# Patient Record
Sex: Male | Born: 1965 | Race: White | Hispanic: No | Smoking: Current every day smoker
Health system: Southern US, Community
[De-identification: ages and names within clinical notes are randomized; demographics above are authoritative.]

## PROBLEM LIST (undated history)

## (undated) DIAGNOSIS — I10 Essential (primary) hypertension: Secondary | ICD-10-CM

## (undated) DIAGNOSIS — B1920 Unspecified viral hepatitis C without hepatic coma: Secondary | ICD-10-CM

## (undated) HISTORY — PX: HIP SURGERY: SHX245

## (undated) HISTORY — PX: KNEE SURGERY: SHX244

## (undated) HISTORY — PX: SHOULDER SURGERY: SHX246

---

## 2004-02-22 ENCOUNTER — Ambulatory Visit (HOSPITAL_COMMUNITY): Admission: RE | Admit: 2004-02-22 | Discharge: 2004-02-22 | Payer: Self-pay | Admitting: Internal Medicine

## 2006-09-16 ENCOUNTER — Ambulatory Visit (HOSPITAL_BASED_OUTPATIENT_CLINIC_OR_DEPARTMENT_OTHER): Admission: RE | Admit: 2006-09-16 | Discharge: 2006-09-16 | Payer: Self-pay | Admitting: Orthopedic Surgery

## 2009-02-11 ENCOUNTER — Ambulatory Visit (HOSPITAL_COMMUNITY): Admission: RE | Admit: 2009-02-11 | Discharge: 2009-02-11 | Payer: Self-pay | Admitting: Internal Medicine

## 2010-06-10 ENCOUNTER — Encounter: Payer: Self-pay | Admitting: Internal Medicine

## 2010-10-06 NOTE — Op Note (Signed)
NAMEVERYL, WINEMILLER                 ACCOUNT NO.:  [id number]   MEDICAL RECORD NO.:  [id number]          PATIENT TYPE:  AMB   LOCATION:  DSC                          FACILITY:  [facility]   PHYSICIAN:  Harvie Junior, M.D.   DATE OF BIRTH:  March 17, 1966   DATE OF PROCEDURE:  09/16/2006  DATE OF DISCHARGE:                               OPERATIVE REPORT   PREOPERATIVE DIAGNOSIS:  Impingement, acromioclavicular joint arthritis,  and superior labral tear with questionable high grade to full thickness  rotator cuff tear.   POSTOPERATIVE DIAGNOSES:  1. Impingement, right shoulder.  2. Acromioclavicular joint arthritis, right shoulder.  3. Posterior superior labral tear.  4. Partial thickness tearing of rotator cuff.   PRINCIPAL PROCEDURE:  1. Arthroscopic subacromial decompression from a lateral and posterior      compartment.  2. Arthroscopic distal clavicle resection of the entire distal portion      of the clavicle through the anterior compartment.  3. Debridement of the posterior superior labrum and the undersurface      of the rotator cuff from within the glenohumeral joint, as well as      subtotal bursectomy in the subacromial space.   SURGEON:  Harvie Junior, M.D.   ASSISTANT:  Marshia Ly, P.A.   ANESTHESIA:  General.   BRIEF HISTORY:  Mr. Borghi is a 45 year old gentleman with a long history  of having significant right shoulder pain.  He was ultimately working in  a [employer], where he fell on some mattress and had sudden onset  of pain.  MRI was obtained, which showed partial thickness, potentially  high grade partial thickness, to maybe small full thickness tear.  He  was evaluated in the office.  I read his MRI and was concerned about  this, as well, because of his failure of conservative care over a long  period of time since his injury, we ultimately felt that surgical  intervention was appropriate and he was brought to the operating room  for this  procedure.   PROCEDURE:  The patient brought to the operating room.  After adequate  anesthesia was obtained with a general anesthetic, the patient was  placed on the operating table.  The right shoulder was prepped and  draped in the usual sterile fashion.  Following this, routine  arthroscopic examination of the shoulder revealed there was an obvious  impingement on the rotator cuff.  This was addressed with subacromial  decompression.  Distal clavicle was resected over a 2-cm segment and  this went uneventfully.  The rotator cuff was evaluated from the  superior surface and no high grade or full thickness lesions could be  seen.  Subtotal bursectomy was performed and a probe was used at length,  top side, to fill the rotator cuff.  Prior to going in the subacromial  space, the surgery started with arthroscopy of the glenohumeral joint.  He was noted to have a posterior superior labral tear, which was  debrided and anteriorly had a sublabral hole, but it did not appear to  be unstable.  The biceps tendon  certainly was not able to be pushed down  into the glenohumeral joint.  The glenohumeral joint itself was  pristine.  Rotator cuff undersurface had no full thickness defect.  There was kind of the arching rim that we sometimes see with some  fibrous tissue coming from that and I evaluated that at length.  I  actually put the shaver on it to just see if it was wispy, but it  actually felt pretty dense on the undersurface and is outlined  previously in the previous paragraph about the superior surface looked  pristine.  So I really do not think he had any kind of high grade or  high level partial, and certainly no full thickness tearing.  After the  surgery had been completed, the shoulder was suctioned dry.  The portals  were closed with a bandage.  Twenty mL of 0.25% Marcaine was instilled  in the shoulder postoperatively by Anesthesia.  A sterile compression  dressing was applied.   The patient taken to recovery and was noted to be  in satisfactory condition.  Estimated blood loss for the procedure was  not significant.      Harvie Junior, M.D.  Electronically Signed     JLG/MEDQ  D:  09/16/2006  T:  09/16/2006  Job:  [id number]

## 2011-02-21 ENCOUNTER — Emergency Department (HOSPITAL_COMMUNITY)
Admission: EM | Admit: 2011-02-21 | Discharge: 2011-02-21 | Disposition: A | Discharge status: Home / Self Care | Payer: Self-pay | Attending: Physician Assistant | Admitting: Physician Assistant

## 2011-02-21 ENCOUNTER — Encounter: Payer: Self-pay | Admitting: Emergency Medicine

## 2011-02-21 DIAGNOSIS — S51009A Unspecified open wound of unspecified elbow, initial encounter: Secondary | ICD-10-CM | POA: Insufficient documentation

## 2011-02-21 DIAGNOSIS — F17200 Nicotine dependence, unspecified, uncomplicated: Secondary | ICD-10-CM | POA: Insufficient documentation

## 2011-02-21 DIAGNOSIS — W268XXA Contact with other sharp object(s), not elsewhere classified, initial encounter: Secondary | ICD-10-CM | POA: Insufficient documentation

## 2011-02-21 DIAGNOSIS — S51019A Laceration without foreign body of unspecified elbow, initial encounter: Secondary | ICD-10-CM

## 2011-02-21 HISTORY — DX: Essential (primary) hypertension: I10

## 2011-02-21 MED ORDER — LIDOCAINE-EPINEPHRINE (PF) 1 %-1:200000 IJ SOLN
INTRAMUSCULAR | Status: AC
  Filled 2011-02-21: qty 10

## 2011-02-21 MED ORDER — LIDOCAINE-EPINEPHRINE (PF) 1 %-1:200000 IJ SOLN: 10.0000 mL | Freq: Once | INTRAMUSCULAR | Status: DC

## 2011-02-21 MED ORDER — TETANUS-DIPHTH-ACELL PERTUSSIS 5-2.5-18.5 LF-MCG/0.5 IM SUSP
0.5000 mL | Freq: Once | INTRAMUSCULAR | Status: AC
  Administered 2011-02-21: 0.5 mL via INTRAMUSCULAR
  Filled 2011-02-21: qty 0.5

## 2011-02-21 NOTE — ED Provider Notes (Signed)
History     CSN: [id number] Arrival date & time: 02/21/2011  5:58 PM  Chief Complaint  Patient presents with  . Laceration    (Consider location/radiation/quality/duration/timing/severity/associated sxs/prior treatment) Patient is a 45 y.o. male presenting with skin laceration. The history is provided by the patient.  Laceration  The incident occurred 1 to 2 hours ago. The laceration is located on the right arm. The laceration is 6 cm in size. The laceration mechanism was a broken glass. The pain is moderate. He reports no foreign bodies present. His tetanus status is out of date.    Past Medical History  Diagnosis Date  . Hypertension     Past Surgical History  Procedure Date  . Hip surgery   . Shoulder surgery   . Knee surgery     History reviewed. No pertinent family history.  History  Substance Use Topics  . Smoking status: Current Everyday Smoker    Types: Cigarettes  . Smokeless tobacco: Not on file  . Alcohol Use: No      Review of Systems  Constitutional: Negative for activity change.       All ROS Neg except as noted in HPI  HENT: Negative for nosebleeds and neck pain.   Eyes: Negative for photophobia and discharge.  Respiratory: Negative for cough, shortness of breath and wheezing.   Cardiovascular: Negative for chest pain and palpitations.  Gastrointestinal: Negative for abdominal pain and blood in stool.  Genitourinary: Negative for dysuria, frequency and hematuria.  Musculoskeletal: Negative for back pain and arthralgias.  Skin: Negative.   Neurological: Negative for dizziness, seizures and speech difficulty.  Psychiatric/Behavioral: Negative for hallucinations and confusion.    Allergies  Codeine  Home Medications  No current outpatient prescriptions on file.  BP 145/84  Pulse 60  Temp(Src) 98.7 F (37.1 C) (Oral)  Resp 20  Ht 6' (1.829 m)  Wt 242 lb (109.77 kg)  BMI 32.82 kg/m2  SpO2 97%  Physical Exam  Nursing note and vitals  reviewed. Constitutional: He is oriented to person, place, and time. He appears well-developed and well-nourished.  Non-toxic appearance.  HENT:  Head: Normocephalic.  Right Ear: Tympanic membrane and external ear normal.  Left Ear: Tympanic membrane and external ear normal.  Eyes: EOM and lids are normal. Pupils are equal, round, and reactive to light.  Neck: Normal range of motion. Neck supple. Carotid bruit is not present.  Cardiovascular: Normal rate, regular rhythm, normal heart sounds, intact distal pulses and normal pulses.   Pulmonary/Chest: Breath sounds normal. No respiratory distress.  Abdominal: Soft. Bowel sounds are normal. There is no tenderness. There is no guarding.  Musculoskeletal: Normal range of motion.       Laceration of the ulnar surface of the forearm/elbow. FROM of fingers and wrist. Sensory wnl. Good cap refill.  Lymphadenopathy:       Head (right side): No submandibular adenopathy present.       Head (left side): No submandibular adenopathy present.    He has no cervical adenopathy.  Neurological: He is alert and oriented to person, place, and time. He has normal strength. No cranial nerve deficit or sensory deficit.  Skin: Skin is warm and dry.  Psychiatric: He has a normal mood and affect. His speech is normal.    ED Course  LACERATION REPAIR Date/Time: 02/21/2011 6:52 PM Performed by: Kathie Dike Authorized by: Kathie Dike Consent: Verbal consent obtained. Risks and benefits: risks, benefits and alternatives were discussed Consent given by: patient Patient  understanding: patient states understanding of the procedure being performed Patient identity confirmed: verbally with patient Time out: Immediately prior to procedure a "time out" was called to verify the correct patient, procedure, equipment, support staff and site/side marked as required. Body area: upper extremity Location details: right elbow Laceration length: 6.3 cm Tendon  involvement: none Nerve involvement: none Vascular damage: no Anesthesia: local infiltration Local anesthetic: lidocaine 1% with epinephrine Patient sedated: no Preparation: Patient was prepped and draped in the usual sterile fashion. Irrigation solution: saline Amount of cleaning: standard Debridement: none Degree of undermining: none Skin closure: staples Number of sutures: 5 Approximation: close Approximation difficulty: simple Dressing: 4x4 sterile gauze and gauze roll Patient tolerance: Patient tolerated the procedure well with no immediate complications. Comments: Sterile dressing applied by me.   (including critical care time)  Labs Reviewed - No data to display No results found.   1. Laceration of elbow       MDM  I have reviewed nursing notes, vital signs, and all appropriate lab and imaging results for this patient.        Kathie Dike, [place] 02/21/11 1902

## 2011-02-21 NOTE — ED Notes (Signed)
Patient with no complaints at this time. Respirations even and unlabored. Skin warm/dry. Discharge instructions reviewed with patient at this time. Patient given opportunity to voice concerns/ask questions. Patient discharged at this time and left Emergency Department with steady gait.   

## 2011-02-21 NOTE — ED Notes (Signed)
Pt has laceration to the rt forearm. Pt was cut with glass while trying to fix a screen door. Tetanus is unknown.

## 2011-02-21 NOTE — ED Provider Notes (Signed)
Medical screening examination/treatment/procedure(s) were performed by non-physician practitioner and as supervising physician I was immediately available for consultation/collaboration.   Benny Lennert, MD 02/21/11 [phone]

## 2011-09-08 ENCOUNTER — Emergency Department (HOSPITAL_COMMUNITY): Payer: Medicaid Other

## 2011-09-08 ENCOUNTER — Encounter (HOSPITAL_COMMUNITY): Payer: Self-pay | Admitting: *Deleted

## 2011-09-08 ENCOUNTER — Emergency Department (HOSPITAL_COMMUNITY)
Admission: EM | Admit: 2011-09-08 | Discharge: 2011-09-08 | Disposition: A | Discharge status: Other Acute Inpatient Hospital | Payer: Medicaid Other | Attending: Emergency Medicine | Admitting: Emergency Medicine

## 2011-09-08 DIAGNOSIS — S41119A Laceration without foreign body of unspecified upper arm, initial encounter: Secondary | ICD-10-CM

## 2011-09-08 DIAGNOSIS — S12100A Unspecified displaced fracture of second cervical vertebra, initial encounter for closed fracture: Secondary | ICD-10-CM | POA: Insufficient documentation

## 2011-09-08 DIAGNOSIS — S9030XA Contusion of unspecified foot, initial encounter: Secondary | ICD-10-CM | POA: Insufficient documentation

## 2011-09-08 DIAGNOSIS — S8010XA Contusion of unspecified lower leg, initial encounter: Secondary | ICD-10-CM | POA: Insufficient documentation

## 2011-09-08 DIAGNOSIS — M79609 Pain in unspecified limb: Secondary | ICD-10-CM | POA: Insufficient documentation

## 2011-09-08 DIAGNOSIS — S41109A Unspecified open wound of unspecified upper arm, initial encounter: Secondary | ICD-10-CM | POA: Insufficient documentation

## 2011-09-08 DIAGNOSIS — IMO0002 Reserved for concepts with insufficient information to code with codable children: Secondary | ICD-10-CM | POA: Insufficient documentation

## 2011-09-08 DIAGNOSIS — I10 Essential (primary) hypertension: Secondary | ICD-10-CM | POA: Insufficient documentation

## 2011-09-08 DIAGNOSIS — M25559 Pain in unspecified hip: Secondary | ICD-10-CM | POA: Insufficient documentation

## 2011-09-08 HISTORY — DX: Unspecified viral hepatitis C without hepatic coma: B19.20

## 2011-09-08 MED ORDER — TETANUS-DIPHTH-ACELL PERTUSSIS 5-2.5-18.5 LF-MCG/0.5 IM SUSP
0.5000 mL | Freq: Once | INTRAMUSCULAR | Status: DC
  Filled 2011-09-08: qty 0.5

## 2011-09-08 MED ORDER — ONDANSETRON HCL 4 MG/2ML IJ SOLN
4.0000 mg | Freq: Once | INTRAMUSCULAR | Status: AC
  Administered 2011-09-08: 4 mg via INTRAVENOUS
  Filled 2011-09-08: qty 2

## 2011-09-08 MED ORDER — HYDROMORPHONE HCL PF 1 MG/ML IJ SOLN
1.0000 mg | Freq: Once | INTRAMUSCULAR | Status: AC
  Administered 2011-09-08: 1 mg via INTRAVENOUS
  Filled 2011-09-08: qty 1

## 2011-09-08 MED ORDER — CEFAZOLIN SODIUM 1-5 GM-% IV SOLN
1.0000 g | Freq: Three times a day (TID) | INTRAVENOUS | Status: DC
  Administered 2011-09-08: 1 g via INTRAVENOUS
  Filled 2011-09-08: qty 50

## 2011-09-08 NOTE — ED Notes (Signed)
Mouth swabs provided to patient at patient request

## 2011-09-08 NOTE — ED Notes (Signed)
Patient reports Tdap in 2012. Dr Judd Lien aware and verbal order to cancel Tdap obtained.

## 2011-09-08 NOTE — ED Notes (Signed)
Report called to [facility] ED. Report given to Ronaldo Miyamoto, [employer].

## 2011-09-08 NOTE — ED Notes (Signed)
[facility] left ED with patient. Patient in NAD upon departure.
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
## 2011-09-08 NOTE — ED Notes (Signed)
Patient requesting pain medication. Dr Judd Lien aware and verbal order for 1 mg dilaudid obtained.
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
## 2016-07-10 ENCOUNTER — Other Ambulatory Visit (HOSPITAL_COMMUNITY): Payer: Self-pay | Admitting: Family Medicine

## 2016-07-10 DIAGNOSIS — M545 Low back pain: Secondary | ICD-10-CM

## 2016-07-12 ENCOUNTER — Ambulatory Visit (HOSPITAL_COMMUNITY): Payer: Medicare Other

## 2016-07-19 ENCOUNTER — Ambulatory Visit (HOSPITAL_COMMUNITY)
Admission: RE | Admit: 2016-07-19 | Discharge: 2016-07-19 | Disposition: A | Discharge status: Home / Self Care | Payer: Medicare Other | Source: Ambulatory Visit | Attending: Family Medicine | Admitting: Family Medicine

## 2016-07-19 DIAGNOSIS — M545 Low back pain: Secondary | ICD-10-CM | POA: Diagnosis not present

## 2016-07-19 DIAGNOSIS — M47896 Other spondylosis, lumbar region: Secondary | ICD-10-CM | POA: Insufficient documentation

## 2016-07-19 DIAGNOSIS — M4807 Spinal stenosis, lumbosacral region: Secondary | ICD-10-CM | POA: Diagnosis not present

## 2016-07-19 DIAGNOSIS — M48061 Spinal stenosis, lumbar region without neurogenic claudication: Secondary | ICD-10-CM | POA: Diagnosis not present

## 2017-09-13 ENCOUNTER — Ambulatory Visit (HOSPITAL_COMMUNITY)
Admission: RE | Admit: 2017-09-13 | Discharge: 2017-09-13 | Disposition: A | Discharge status: Home / Self Care | Payer: Medicare HMO | Source: Ambulatory Visit | Attending: Family Medicine | Admitting: Family Medicine

## 2017-09-13 ENCOUNTER — Other Ambulatory Visit (HOSPITAL_COMMUNITY): Payer: Self-pay | Admitting: Family Medicine

## 2017-09-13 DIAGNOSIS — M25551 Pain in right hip: Secondary | ICD-10-CM | POA: Diagnosis not present

## 2017-09-13 DIAGNOSIS — R52 Pain, unspecified: Secondary | ICD-10-CM

## 2017-09-13 DIAGNOSIS — G8929 Other chronic pain: Secondary | ICD-10-CM | POA: Diagnosis present

## 2018-08-11 ENCOUNTER — Other Ambulatory Visit (HOSPITAL_COMMUNITY): Payer: Self-pay | Admitting: Family Medicine

## 2018-08-11 ENCOUNTER — Other Ambulatory Visit: Payer: Self-pay

## 2018-08-11 ENCOUNTER — Ambulatory Visit (HOSPITAL_COMMUNITY)
Admission: RE | Admit: 2018-08-11 | Discharge: 2018-08-11 | Disposition: A | Discharge status: Home / Self Care | Payer: Medicare HMO | Source: Ambulatory Visit | Attending: Family Medicine | Admitting: Family Medicine

## 2018-08-11 DIAGNOSIS — M25512 Pain in left shoulder: Secondary | ICD-10-CM

## 2020-07-04 IMAGING — DX LEFT SHOULDER - 2+ VIEW
3 series · 3 of 3 positions shown · non-contrast
Comparison: None.

CLINICAL DATA: Left shoulder pain radiating into the left arm for 2
months. No known injury.

EXAM:
LEFT SHOULDER - 2+ VIEW

[shoulder grashey]
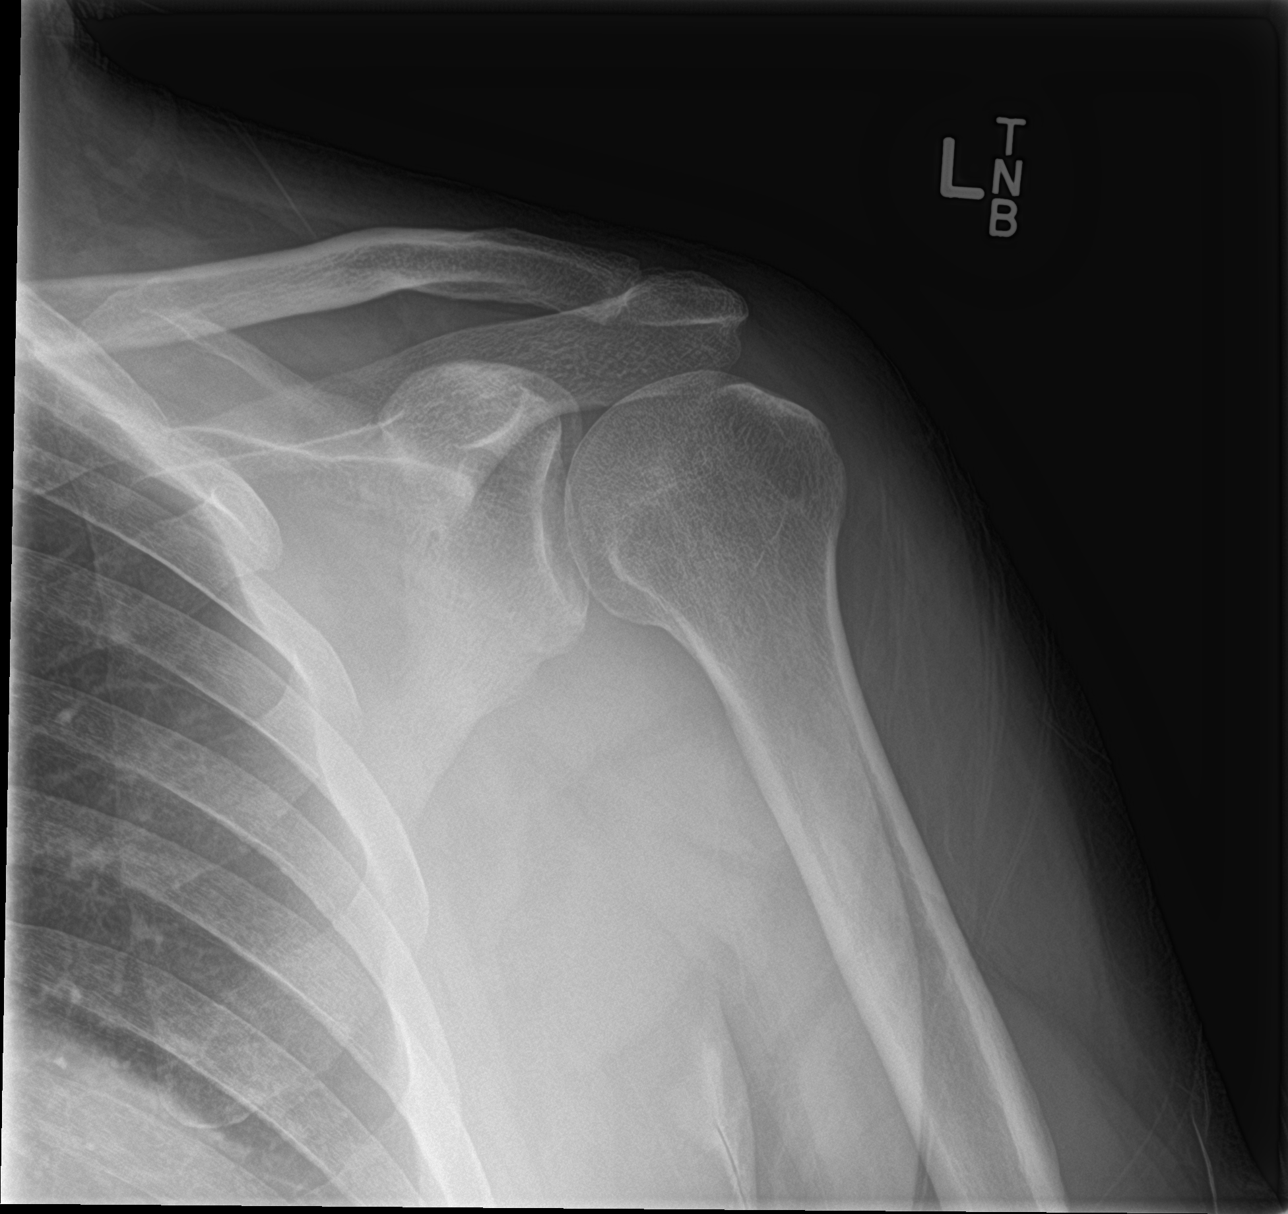

[shoulder y view]
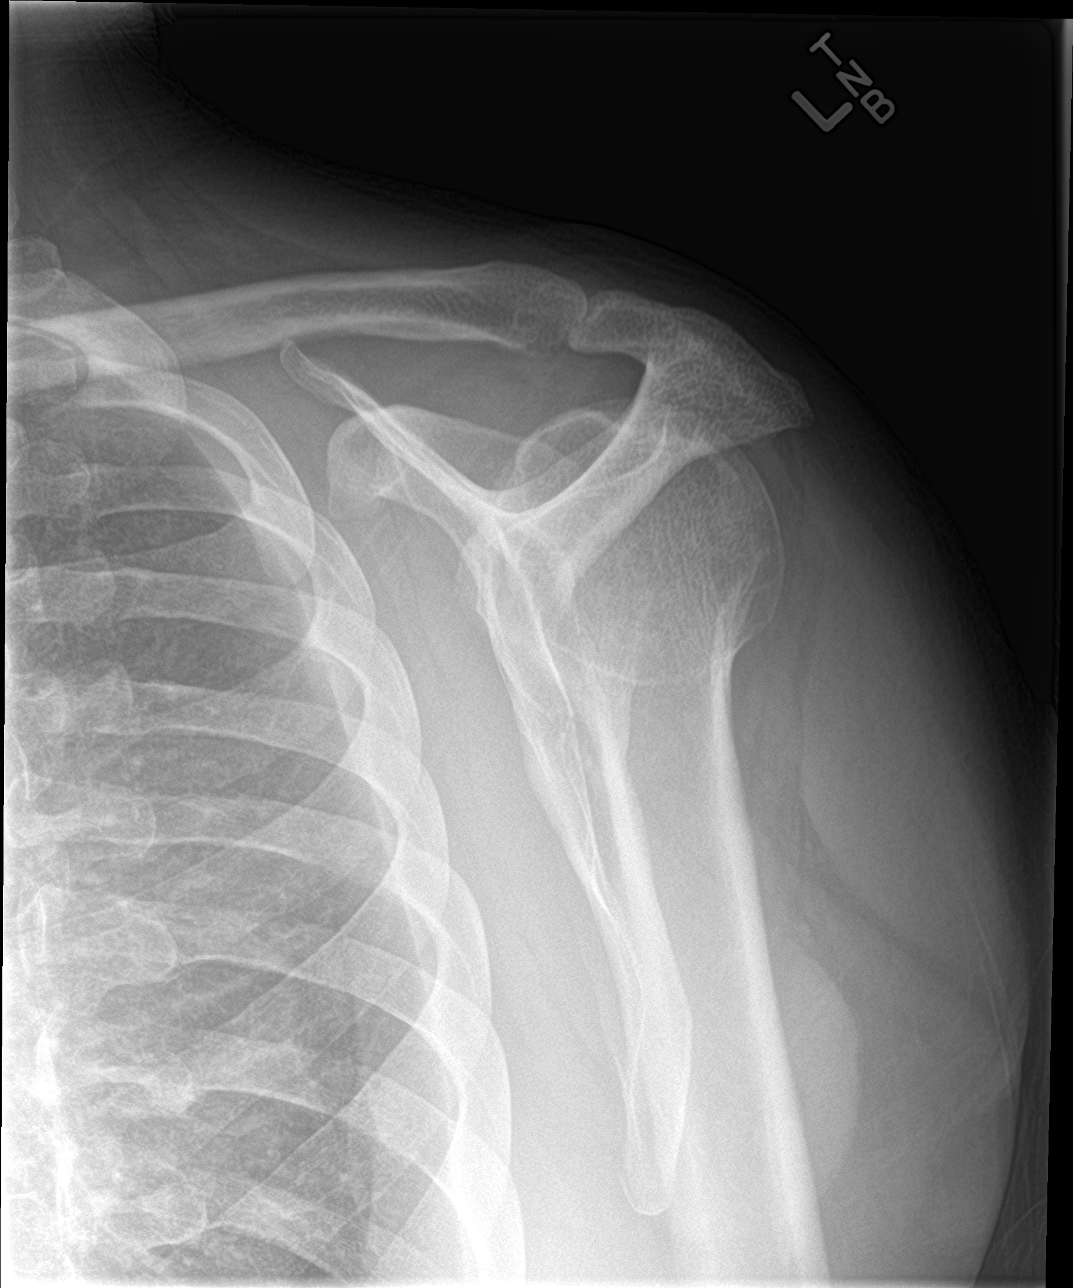

[shoulder axillary]
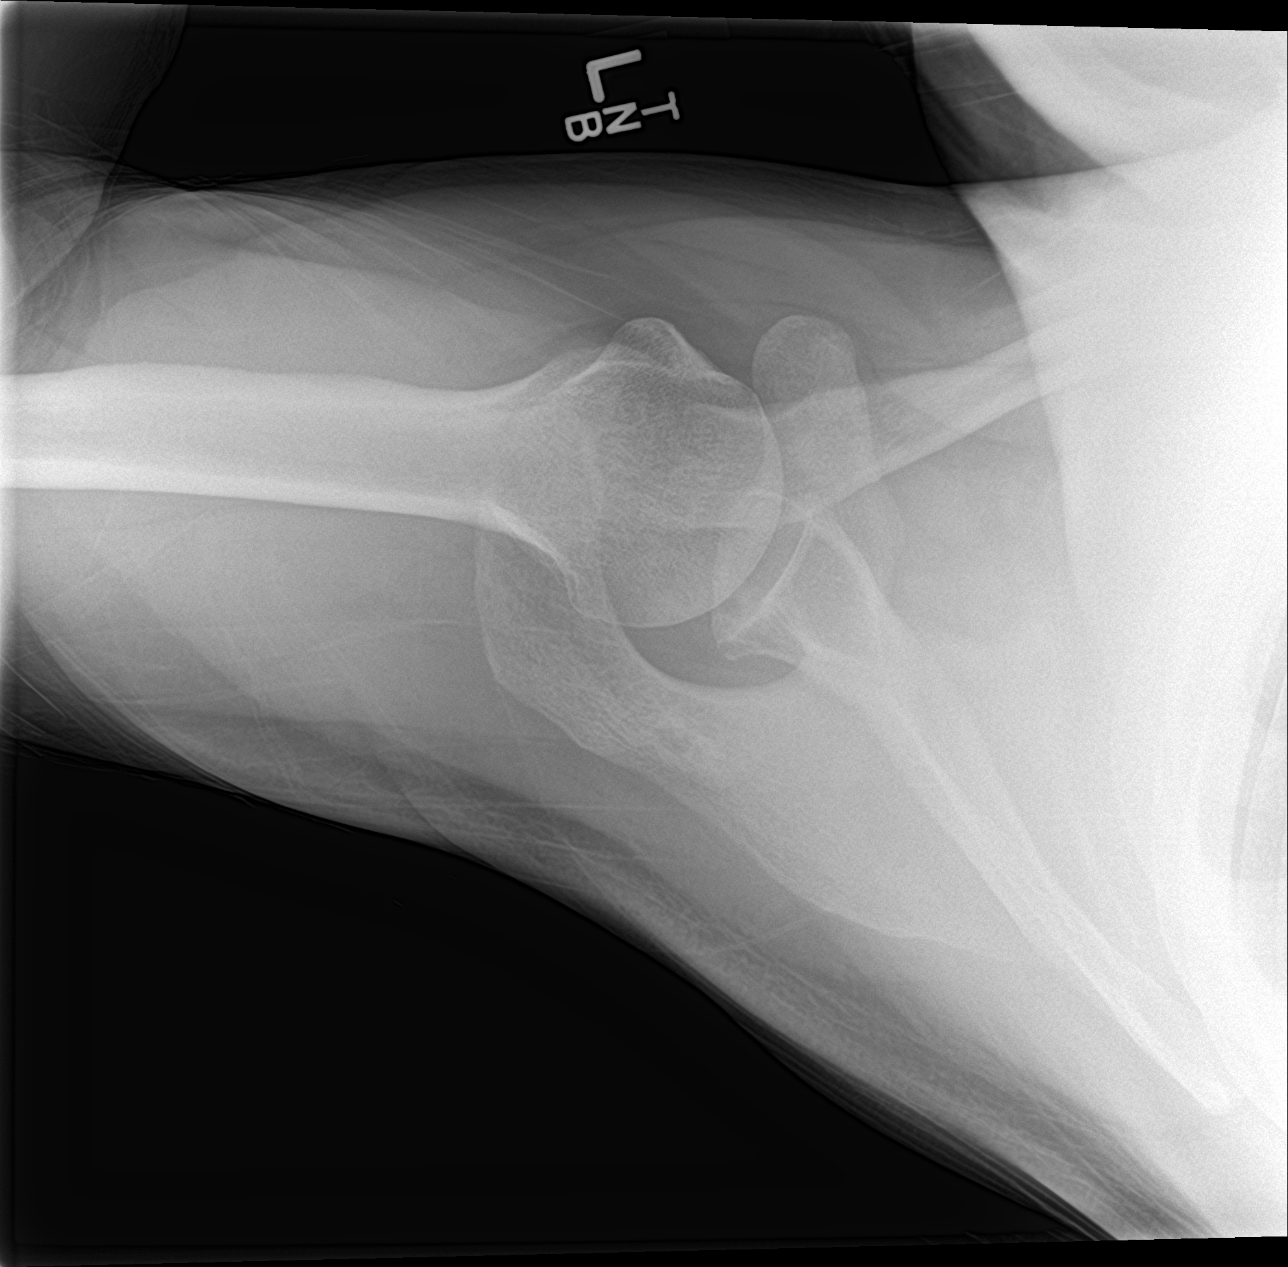

[3 of 3 positions shown; findings below may reference images not displayed]

FINDINGS: There is no evidence of fracture or dislocation. There is no
evidence of arthropathy or other focal bone abnormality. Soft
tissues are unremarkable.
IMPRESSION: Normal exam.

## 2021-05-31 ENCOUNTER — Ambulatory Visit (INDEPENDENT_AMBULATORY_CARE_PROVIDER_SITE_OTHER): Payer: Medicare HMO

## 2021-05-31 ENCOUNTER — Ambulatory Visit (INDEPENDENT_AMBULATORY_CARE_PROVIDER_SITE_OTHER): Payer: Medicare HMO | Admitting: Orthopaedic Surgery

## 2021-05-31 ENCOUNTER — Other Ambulatory Visit: Payer: Self-pay

## 2021-05-31 VITALS — Ht 70.0 in | Wt 202.0 lb

## 2021-05-31 DIAGNOSIS — M545 Low back pain, unspecified: Secondary | ICD-10-CM

## 2021-05-31 DIAGNOSIS — M25551 Pain in right hip: Secondary | ICD-10-CM | POA: Diagnosis not present

## 2021-05-31 DIAGNOSIS — G8929 Other chronic pain: Secondary | ICD-10-CM | POA: Diagnosis not present

## 2021-05-31 MED ORDER — TIZANIDINE HCL 4 MG PO TABS: 4.0000 mg | ORAL_TABLET | Freq: Three times a day (TID) | ORAL | 1 refills | Status: DC | PRN

## 2021-05-31 MED ORDER — DIAZEPAM 5 MG PO TABS: 5.0000 mg | ORAL_TABLET | Freq: Once | ORAL | 0 refills | Status: AC

## 2021-05-31 NOTE — Progress Notes (Signed)
Office Visit Note   Patient: Spencer Cooley           Date of Birth: 01/24/1966           MRN: [id number] Visit Date: 05/31/2021              Requested by: Jarvis Morgan, DO [address] [place],  [place] [address] PCP: Jarvis Morgan, DO   Assessment & Plan: Visit Diagnoses:  1. Pain in right hip   2. Chronic bilateral low back pain without sciatica     Plan: According to previous notes he has had I believe right-sided hemilaminectomies at L2-L3 and L3-L4 and evacuation of the abscess in the lower thoracic spine.  There is concerned this is affecting the architecture of his back and he may have any type of recurrence of infection.  Without being said he needs a MRI of his thoracic and lumbar spine given the extensive nature of the abscess and surgeries he has had on his spine.  I will send in Valium to get him through the MRI since he is claustrophobic.  I will also start him on Zanaflex as a muscle relaxant.  We will see him back once we have this MRI.  All question concerns were answered and addressed.  Follow-Up Instructions: No follow-ups on file.   Follow-up after MRI.  Orders:  Orders Placed This Encounter  Procedures   XR HIP UNILAT W OR W/O PELVIS 1V RIGHT   XR Lumbar Spine 2-3 Views   Meds ordered this encounter  Medications   tiZANidine (ZANAFLEX) 4 MG tablet    Sig: Take 1 tablet (4 mg total) by mouth every 8 (eight) hours as needed for muscle spasms.    Dispense:  60 tablet    Refill:  1   diazepam (VALIUM) 5 MG tablet    Sig: Take 1 tablet (5 mg total) by mouth once for 1 dose. Take one tablet 30 minutes prior to MRI.    Dispense:  1 tablet    Refill:  0      Procedures: No procedures performed   Clinical Data: No additional findings.   Subjective: Chief Complaint  Patient presents with   Right Hip - Pain  The patient comes in today for evaluation treatment of right hip pain but is really having right-sided low back pain.  I have actually seen  him remotely in the past for his shoulder.  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  He was hospitalized for about 2 months and had IV antibiotics and significant surgery on his lumbar spine and thoracic spine by neurosurgery.  There is no instrumentation in his back.  He had done well for a while but is starting to have worsening posture and right-sided low back pain.  He says he cannot lay flat on his back and is getting worse for him.  He has had no acute illnesses.  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  He is not on any narcotic pain medications either.  He denies any radicular symptoms.  He has just a small amount of groin pain on the right side but most of his pain is in the low back to the right side.  HPI  Review of Systems   Objective: Vital Signs: Ht 5\' 10"  (1.778 m)    Wt 202 lb (91.6  kg)    BMI 28.98 kg/m   Physical Exam He is alert and orient x3 and in no acute distress but obvious discomfort.  He cannot stand up straight. Ortho Exam Examination of both hips shows that they move smoothly and fluidly with just a slight stiffness on the right side and minimal pain in the groin on the right or left side.  He has a significant positive straight leg raise to the right side and with the left leg to the right side.  There is no significant weakness in his legs but he is having quite significant low back pain to palpation of the right side. Specialty Comments:  No specialty comments available.  Imaging: XR HIP UNILAT W OR W/O PELVIS 1V RIGHT  Result Date: 05/31/2021 An [facility] pelvis and lateral of the right hip shows slight joint space narrowing when comparing the 2 hips.  There is slight sclerotic changes in the acetabulum and femoral head.  There is evidence of previous proximal third femoral shaft fracture that is healed.  Hardware has been removed.  XR Lumbar Spine 2-3  Views  Result Date: 05/31/2021 2 views of the lumbar spine show no acute findings.  There is significant disc space narrowing of the lower thoracic spine levels.  There is also an increase in lumbar lordosis and a spondylolisthesis at the mid lumbar spine.    PMFS History: There are no problems to display for this patient.  Past Medical History:  Diagnosis Date   Hepatitis C    Hypertension     No family history on file.  Past Surgical History:  Procedure Laterality Date   HIP SURGERY     KNEE SURGERY     SHOULDER SURGERY     Social History   Occupational History   Not on file  Tobacco Use   Smoking status: Every Day    Types: Cigarettes   Smokeless tobacco: Not on file  Substance and Sexual Activity   Alcohol use: No   Drug use: No   Sexual activity: Not on file

## 2021-06-25 ENCOUNTER — Other Ambulatory Visit: Payer: Self-pay | Admitting: Orthopaedic Surgery

## 2021-07-06 ENCOUNTER — Encounter: Payer: Self-pay | Admitting: *Deleted

## 2021-09-24 ENCOUNTER — Other Ambulatory Visit: Payer: Self-pay | Admitting: Orthopaedic Surgery

## 2021-10-13 ENCOUNTER — Telehealth: Payer: Self-pay

## 2021-10-13 NOTE — Telephone Encounter (Signed)
Sarah from [facility] called and states Dr. Magnus Ivan has been prescribing Zanaflex and another provider is prescribing Flexeril. Would like for dr to be aware and advise if he needs to be taking both. Please call pharmacy back.   [phone]

## 2021-10-17 NOTE — Telephone Encounter (Signed)
Pharmacy aware to just not fill the zanaflex

## 2021-12-11 ENCOUNTER — Other Ambulatory Visit: Payer: Self-pay | Admitting: Orthopaedic Surgery

## 2022-04-02 ENCOUNTER — Other Ambulatory Visit: Payer: Self-pay | Admitting: Orthopaedic Surgery

## 2022-07-28 ENCOUNTER — Other Ambulatory Visit: Payer: Self-pay | Admitting: Orthopaedic Surgery

## 2022-07-30 ENCOUNTER — Other Ambulatory Visit: Payer: Self-pay | Admitting: Physician Assistant

## 2022-07-30 MED ORDER — TIZANIDINE HCL 4 MG PO TABS: 4.0000 mg | ORAL_TABLET | Freq: Three times a day (TID) | ORAL | 0 refills | Status: AC | PRN

## 2022-07-30 NOTE — Telephone Encounter (Signed)
Tried calling but the number on file is no longer in service

## 2022-08-28 ENCOUNTER — Other Ambulatory Visit: Payer: Self-pay | Admitting: Physician Assistant
